# Patient Record
Sex: Male | Born: 2000 | Hispanic: Yes | Marital: Single | Smoking: Current some day smoker
Health system: Southern US, Community
[De-identification: ages and names within clinical notes are randomized; demographics above are authoritative.]

---
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
## 2021-12-27 ENCOUNTER — Other Ambulatory Visit: Payer: Self-pay | Admitting: Nurse Practitioner

## 2021-12-27 ENCOUNTER — Ambulatory Visit
Admission: RE | Admit: 2021-12-27 | Discharge: 2021-12-27 | Disposition: A | Discharge status: Home / Self Care | Payer: Worker's Compensation | Attending: Nurse Practitioner | Admitting: Nurse Practitioner

## 2021-12-27 ENCOUNTER — Ambulatory Visit
Admission: RE | Admit: 2021-12-27 | Discharge: 2021-12-27 | Disposition: A | Discharge status: Home / Self Care | Payer: Worker's Compensation | Source: Ambulatory Visit | Attending: Nurse Practitioner | Admitting: Nurse Practitioner

## 2021-12-27 DIAGNOSIS — M79671 Pain in right foot: Secondary | ICD-10-CM
